# Patient Record
Sex: Female | Born: 1970 | Race: White | Hispanic: No | Marital: Single
Health system: Southern US, Community
[De-identification: ages and names within clinical notes are randomized; demographics above are authoritative.]

---

## 2020-03-18 ENCOUNTER — Encounter: Payer: Self-pay | Admitting: Family Medicine

## 2020-03-18 ENCOUNTER — Ambulatory Visit: Payer: Self-pay

## 2020-03-18 ENCOUNTER — Ambulatory Visit (INDEPENDENT_AMBULATORY_CARE_PROVIDER_SITE_OTHER): Payer: Managed Care, Other (non HMO) | Admitting: Family Medicine

## 2020-03-18 ENCOUNTER — Other Ambulatory Visit: Payer: Self-pay

## 2020-03-18 VITALS — BP 110/68 | HR 72 | Ht 66.0 in | Wt 121.0 lb

## 2020-03-18 DIAGNOSIS — M79604 Pain in right leg: Secondary | ICD-10-CM | POA: Diagnosis not present

## 2020-03-18 DIAGNOSIS — M25521 Pain in right elbow: Secondary | ICD-10-CM | POA: Diagnosis not present

## 2020-03-18 DIAGNOSIS — S56519A Strain of other extensor muscle, fascia and tendon at forearm level, unspecified arm, initial encounter: Secondary | ICD-10-CM

## 2020-03-18 DIAGNOSIS — S76311A Strain of muscle, fascia and tendon of the posterior muscle group at thigh level, right thigh, initial encounter: Secondary | ICD-10-CM

## 2020-03-18 NOTE — Patient Instructions (Addendum)
Xray today Lateral epi exercises Avoid overhand exercises Ice 20 min 2x a day Voltaren gel Wrist brace day and night for 2 weeks then at night for 2 weeks Thigh compression sleeve Shorten stride length See me again in 6 weeks

## 2020-03-18 NOTE — Assessment & Plan Note (Signed)
Partial tear noted fairly severe overall.  Patient is bringing in some blood flow.  Patient has had a history of headache so we will hold on nitroglycerin.  We did discuss potential PRP.  Wrist brace given today, avoid repetitive wrist extension, icing regimen encouraged.  Follow-up with me again 6 weeks.  Worsening pain will consider possible formal physical therapy as well as x-rays at next visit.  Patient wanted to hold on x-rays today secondary to transitioning insurance.

## 2020-03-18 NOTE — Assessment & Plan Note (Signed)
Nonspecific pain.  Has discomfort noted in the right hamstring muscle belly.  On ultrasound today no true masses appreciated but does have some thickening of the fascia.  This could be more of a chronic issue.  He is overdue.  Blood vessel in the area as well but fully compressible.  Discussed conservative therapy with heel lift, shortening stride length thigh compression and askilng exercises patient will try the conservative therapy and follow-up with me again in 6 weeks

## 2020-03-18 NOTE — Progress Notes (Signed)
[facility] [address] [place] [address] Phone: [phone] Subjective:   Bruce Donath, am serving as a scribe for Dr. Antoine Primas. This visit occurred during the SARS-CoV-2 public health emergency.  Safety protocols were in place, including screening questions prior to the visit, additional usage of staff PPE, and extensive cleaning of exam room while observing appropriate contact time as indicated for disinfecting solutions.   I'm seeing this patient by the request  of:  Precious Bard, MD  CC: Right elbow and right hamstring pain  [id number]  Stefanie Jenkins is a 50 y.o. female coming in with complaint of right hamstring pain for one year. Patient does not recall incident that caused pain. Pain located in mm belly. Uses LAX ball to relieve pain. Has also tried physical therapy and feels that activity helps to reduce her pain. Pain is 5/10 but is nagging. Patient walks, bikes and rollerblades.  Also complaining of right sided, lateral elbow pain for about 4 months. Having hard time lifting glass of water.  Patient does not remember any true injury.  Patient does do a lot of work at a computer.  Patient though has been working less recently and may be making a little improvement.  Has history of autoimmune issues. 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 Is gluten and dairy free.         Social History   Socioeconomic History  . Marital status: Single    Spouse name: Not on file  . Number of children: Not on file  . Years of education: Not on file  . Highest education level: Not on file  Occupational History  . Not on file  Tobacco Use  . Smoking status: Not on file  . Smokeless tobacco: Not on file  Substance and Sexual Activity  . Alcohol use: Not on file  . Drug use: Not on file  . Sexual activity: Not on file  Other Topics Concern  . Not on file  Social History Narrative  .  Not on file   Social Determinants of Health   Financial Resource Strain: Not on file  Food Insecurity: Not on file  Transportation Needs: Not on file  Physical Activity: Not on file  Stress: Not on file  Social Connections: Not on file   Not on File No family history on file.    Current Outpatient Medications (Respiratory):  .  montelukast (SINGULAIR) 10 MG tablet, Take 10 mg by mouth at bedtime.      Reviewed prior external information including notes and imaging from  primary care provider As well as notes that were available from care everywhere and other healthcare systems.  Past medical history, social, surgical and family history all reviewed in electronic medical record.  No pertanent information unless stated regarding to the chief complaint.   Review of Systems:  No headache, visual changes, nausea, vomiting, diarrhea, constipation, dizziness, abdominal pain, skin rash, fevers, chills, night sweats, weight loss, swollen lymph nodes, body aches, joint swelling, chest pain, shortness of breath, mood changes. POSITIVE muscle aches  Objective  Blood pressure 110/68, pulse 72, height 5\' 6"  (1.676 m), weight 121 lb (54.9 kg), SpO2 99 %.   General: No apparent distress alert and oriented x3 mood and affect normal, dressed appropriately.  HEENT: Pupils equal, extraocular movements intact  Respiratory: Patient's speak in full sentences and does not appear short of breath  Cardiovascular: No lower extremity edema,  non tender, no erythema  Gait mild antalgic MSK: Right hamstring does have tightness noted in the muscle belly but no true masses appreciated.  Patient does have good strength but does have pain with resisted flexion.  Full range of motion noted.  No pain in the knee.  Negative straight leg test.  Mild loss of lordosis of the lumbar spine noted.  Patient's right elbow does have tenderness over the lateral epicondylar area.  Worsening pain with resisted extension of  the wrist.  Patient no swelling noted no.  Full range of motion of the elbow.  Good grip strength.  Limited musculoskeletal ultrasound was performed and interpreted by Judi Saa  Limited ultrasound of patient's right lateral epicondylar area shows that patient does have a partial tear noted of the common extensor tendon.  Significant increase in neovascularization in Doppler flow noted.  Mild hypoechoic changes.  Possible mild retraction of the tendon noted.  Does seem to be more chronic or acute on chronic.  Regarding patient's hamstring possible thickening between the semitendinosus and the semimembranosus approximately 6 inches from ischial area.  Otherwise fairly unremarkable with no true masses appreciated.  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

## 2020-03-25 ENCOUNTER — Encounter: Payer: Self-pay | Admitting: *Deleted

## 2020-03-25 ENCOUNTER — Telehealth: Payer: Self-pay | Admitting: *Deleted

## 2020-03-25 NOTE — Telephone Encounter (Signed)
Pt called stating that Dr. Katrinka Blazing had mentioned trying nitroglycerin patches and at the OV she declined. But now after thinking about it she would like an rx sent into the [facility] pharmacy in [place].

## 2020-03-25 NOTE — Telephone Encounter (Signed)
I did not go over all the potential side effects and do not have them written down and she iws not in my chart.  I would say I would want her to know that information before we do it so then we should discuss at follow up

## 2020-03-25 NOTE — Telephone Encounter (Signed)
Can we print this for her.  Then we can send in the prescription when she picks it up Do the 0.1 patches   Nitroglycerin Protocol   Apply 1/4 nitroglycerin patch to affected area daily.  Change position of patch within the affected area every 24 hours.  You may experience a headache during the first 1-2 weeks of using the patch, these should subside.  If you experience headaches after beginning nitroglycerin patch treatment, you may take your preferred over the counter pain reliever.  Another side effect of the nitroglycerin patch is skin irritation or rash related to patch adhesive.  Please notify our office if you develop more severe headaches or rash, and stop the patch.  Tendon healing with nitroglycerin patch may require 12 to 24 weeks depending on the extent of injury.  Men should not use if taking Viagra, Cialis, or Levitra.   Do not use if you have migraines or rosacea.

## 2020-03-30 ENCOUNTER — Other Ambulatory Visit: Payer: Self-pay

## 2020-03-30 MED ORDER — NITROGLYCERIN 0.1 MG/HR TD PT24: 0.1000 mg | MEDICATED_PATCH | Freq: Every day | TRANSDERMAL | 0 refills | Status: AC

## 2020-03-30 NOTE — Telephone Encounter (Signed)
Paper script ok per a verbal from Dr. Katrinka Blazing. Script and instructions left at front desk. Patient notified.

## 2020-04-29 ENCOUNTER — Ambulatory Visit: Payer: Managed Care, Other (non HMO) | Admitting: Family Medicine

## 2020-05-17 ENCOUNTER — Ambulatory Visit: Payer: Managed Care, Other (non HMO) | Admitting: Family Medicine

## 2020-05-28 NOTE — Progress Notes (Signed)
[facility] [address] [place] [address] Phone: [phone] Subjective:   Stefanie Jenkins, am serving as a scribe for Dr. Antoine Primas. This visit occurred during the SARS-CoV-2 public health emergency.  Safety protocols were in place, including screening questions prior to the visit, additional usage of staff PPE, and extensive cleaning of exam room while observing appropriate contact time as indicated for disinfecting solutions.   I'm seeing this patient by the request  of:  Precious Bard, MD  CC: Extremity and elbow pain follow-up  BHA:[id number]   03/18/2020 Nonspecific pain.  Has discomfort noted in the right hamstring muscle belly.  On ultrasound today no true masses appreciated but does have some thickening of the fascia.  This could be more of a chronic issue.  He is overdue.  Blood vessel in the area as well but fully compressible.  Discussed conservative therapy with heel lift, shortening stride length thigh compression and askilng exercises patient will try the conservative therapy and follow-up with me again in 6 weeks  Partial tear noted fairly severe overall.  Patient is bringing in some blood flow.  Patient has had a history of headache so we will hold on nitroglycerin.  We did discuss potential PRP.  Wrist brace given today, avoid repetitive wrist extension, icing regimen encouraged.  Follow-up with me again 6 weeks.  Worsening pain will consider possible formal physical therapy as well as x-rays at next visit.  Patient wanted to hold on x-rays today secondary to transitioning insurance.  Update 05/31/2020 Stefanie Jenkins is a 50 y.o. female coming in with complaint of right elbow pain and right hamstring pain. Arm pain is improving. Does have tenderness in elbow. Would like to make ergonomic changes at her desk. Has not used nitro patches since last visit.   Patient notes getting a Peloton bike and her leg pain is better when she is on  the bike. Pain continues after activity and when riding in the car. Does wear thigh compression sleeve with activity but is developing heat rash.        No past medical history on file. No past surgical history on file. Social History   Socioeconomic History  . Marital status: Single    Spouse name: Not on file  . Number of children: Not on file  . Years of education: Not on file  . Highest education level: Not on file  Occupational History  . Not on file  Tobacco Use  . Smoking status: Not on file  . Smokeless tobacco: Not on file  Substance and Sexual Activity  . Alcohol use: Not on file  . Drug use: Not on file  . Sexual activity: Not on file  Other Topics Concern  . Not on file  Social History Narrative  . Not on file   Social Determinants of Health   Financial Resource Strain: Not on file  Food Insecurity: Not on file  Transportation Needs: Not on file  Physical Activity: Not on file  Stress: Not on file  Social Connections: Not on file   Not on File No family history on file.   Current Outpatient Medications (Cardiovascular):  .  nitroGLYCERIN (NITRODUR - DOSED IN MG/24 HR) 0.1 mg/hr patch, Place 1 patch (0.1 mg total) onto the skin daily. Place 1/4 of a patch on skin once daily.  Current Outpatient Medications (Respiratory):  .  montelukast (SINGULAIR) 10 MG tablet, Take 10 mg by mouth at bedtime.  Current Outpatient Medications (Other):  .  gabapentin (NEURONTIN) 100 MG capsule, Take 2 capsules (200 mg total) by mouth at bedtime.   Reviewed prior external information including notes and imaging from  primary care provider As well as notes that were available from care everywhere and other healthcare systems.  Past medical history, social, surgical and family history all reviewed in electronic medical record.  No pertanent information unless stated regarding to the chief complaint.   Review of Systems:  No headache, visual changes, nausea,  vomiting, diarrhea, constipation, dizziness, abdominal pain, skin rash, fevers, chills, night sweats, weight loss, swollen lymph nodes, body aches, joint swelling, chest pain, shortness of breath, mood changes. POSITIVE muscle aches  Objective  Blood pressure 110/72, pulse 79, height 5\' 6"  (1.676 m), weight 120 lb (54.4 kg), SpO2 99 %.   General: No apparent distress alert and oriented x3 mood and affect normal, dressed appropriately.  HEENT: Pupils equal, extraocular movements intact  Respiratory: Patient's speak in full sentences and does not appear short of breath  Cardiovascular: No lower extremity edema, non tender, no erythema  Gait normal with good balance and coordination.  MSK:  Right elbow TTP over the lateral epi area, mild pain with resistance of the wrist  Contralateral elbow fairly unremarkable. Patient's low back mild tenderness to palpation of the right sacroiliac joint.  Tightness noted with straight leg test on the right side.  Hamstring does have some tenderness noted in the mid substance.  No masses appreciated.  Patient still has some mild pain with resisted flexion of the knee in the mid substance of the hamstring.  No pain over the ischial area.  Limited musculoskeletal ultrasound was performed and interpreted by  Limited ultrasound of patient's right elbow shows that at the lateral epicondylar region the common extensor tendon still has a superficial tear noted.  Hypoechoic changes noted.  Significant increase in Doppler flow in the vicinity as well. 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

## 2020-05-31 ENCOUNTER — Ambulatory Visit: Payer: No Typology Code available for payment source

## 2020-05-31 ENCOUNTER — Other Ambulatory Visit: Payer: Self-pay

## 2020-05-31 ENCOUNTER — Ambulatory Visit (INDEPENDENT_AMBULATORY_CARE_PROVIDER_SITE_OTHER): Payer: No Typology Code available for payment source | Admitting: Family Medicine

## 2020-05-31 ENCOUNTER — Ambulatory Visit: Payer: Self-pay

## 2020-05-31 ENCOUNTER — Ambulatory Visit (INDEPENDENT_AMBULATORY_CARE_PROVIDER_SITE_OTHER): Payer: No Typology Code available for payment source

## 2020-05-31 ENCOUNTER — Encounter: Payer: Self-pay | Admitting: Family Medicine

## 2020-05-31 VITALS — BP 110/72 | HR 79 | Ht 66.0 in | Wt 120.0 lb

## 2020-05-31 DIAGNOSIS — S56519A Strain of other extensor muscle, fascia and tendon at forearm level, unspecified arm, initial encounter: Secondary | ICD-10-CM | POA: Diagnosis not present

## 2020-05-31 DIAGNOSIS — S76311D Strain of muscle, fascia and tendon of the posterior muscle group at thigh level, right thigh, subsequent encounter: Secondary | ICD-10-CM

## 2020-05-31 DIAGNOSIS — N762 Acute vulvitis: Secondary | ICD-10-CM | POA: Insufficient documentation

## 2020-05-31 DIAGNOSIS — M25521 Pain in right elbow: Secondary | ICD-10-CM | POA: Diagnosis not present

## 2020-05-31 DIAGNOSIS — M79604 Pain in right leg: Secondary | ICD-10-CM

## 2020-05-31 MED ORDER — GABAPENTIN 100 MG PO CAPS: 200.0000 mg | ORAL_CAPSULE | Freq: Every day | ORAL | 0 refills | Status: DC

## 2020-05-31 NOTE — Assessment & Plan Note (Signed)
Patient feels like she has not made any significant progress.  Would like to get x-rays to further evaluate to make sure patient does not have any other bony abnormality that can contribute.  Differential does include the possibility of lumbar radiculopathy and will need to consider the possibility of an MRI.  Patient does have a mild positive straight leg test.  Patient is adamant she would like to know what is going on and would like to wait not longer so she would like [place] to order the MRI now.  Patient has already failed greater than 6 weeks of conservative therapy.  Discussed with patient about icing regimen, home exercises, which activities to do which wants to avoid.  Follow-up with me again 6 weeks

## 2020-05-31 NOTE — Assessment & Plan Note (Signed)
Patient is some partial tearing noted.  Discussed with patient in great length.  Discussed with patient about the possibility of the nitroglycerin patch which patient never started.  Patient has a.  Warned of side effects again.  Patient will consider starting it.  We also discussed the other alternative is PRP.  Patient will consider it.  Discussed icing regimen and home exercises.  Follow-up with me again 4 days weeks

## 2020-05-31 NOTE — Patient Instructions (Addendum)
Xray today Gabapentin 100mg  at night and can bump up to 200mg  if needed MRI lumbar spine [id number] Read about PRP Nitroglycerin Protocol   Apply 1/4 nitroglycerin patch to affected area daily.  Change position of patch within the affected area every 24 hours.  You may experience a headache during the first 1-2 weeks of using the patch, these should subside.  If you experience headaches after beginning nitroglycerin patch treatment, you may take your preferred over the counter pain reliever.  Another side effect of the nitroglycerin patch is skin irritation or rash related to patch adhesive.  Please notify our office if you develop more severe headaches or rash, and stop the patch.  Tendon healing with nitroglycerin patch may require 12 to 24 weeks depending on the extent of injury.  Men should not use if taking Viagra, Cialis, or Levitra.   Do not use if you have migraines or rosacea.  See me again in 6 weeks

## 2020-06-30 ENCOUNTER — Other Ambulatory Visit: Payer: Self-pay | Admitting: Family Medicine

## 2020-07-13 ENCOUNTER — Ambulatory Visit
Admission: RE | Admit: 2020-07-13 | Discharge: 2020-07-13 | Disposition: A | Discharge status: Home / Self Care | Payer: No Typology Code available for payment source | Source: Ambulatory Visit | Attending: Family Medicine | Admitting: Family Medicine

## 2020-07-13 ENCOUNTER — Other Ambulatory Visit: Payer: Self-pay

## 2020-07-13 DIAGNOSIS — M25521 Pain in right elbow: Secondary | ICD-10-CM

## 2020-08-14 ENCOUNTER — Other Ambulatory Visit: Payer: Self-pay | Admitting: Family Medicine

## 2020-08-20 ENCOUNTER — Other Ambulatory Visit: Payer: Self-pay | Admitting: Family Medicine

## 2020-08-23 NOTE — Telephone Encounter (Signed)
Sent patient MyChart message to confirm how she is using medication.

## 2020-08-27 MED ORDER — GABAPENTIN 100 MG PO CAPS: 200.0000 mg | ORAL_CAPSULE | Freq: Every day | ORAL | 0 refills | Status: DC

## 2020-09-22 ENCOUNTER — Ambulatory Visit: Payer: No Typology Code available for payment source | Admitting: Family Medicine

## 2020-10-05 ENCOUNTER — Other Ambulatory Visit: Payer: Self-pay | Admitting: Family Medicine

## 2020-11-18 ENCOUNTER — Other Ambulatory Visit: Payer: Self-pay | Admitting: Family Medicine

## 2020-12-25 ENCOUNTER — Other Ambulatory Visit: Payer: Self-pay | Admitting: Family Medicine

## 2021-01-22 ENCOUNTER — Other Ambulatory Visit: Payer: Self-pay | Admitting: Family Medicine

## 2021-03-14 ENCOUNTER — Other Ambulatory Visit: Payer: Self-pay | Admitting: Family Medicine

## 2022-07-01 IMAGING — MR MR LUMBAR SPINE W/O CM
4 of 5 series · 25 of 48 positions shown · non-contrast
Comparison: None.

CLINICAL DATA: Lower back pain for over 6 weeks. Right gluteal and
leg pain for 2 years

EXAM:
MRI LUMBAR SPINE WITHOUT CONTRAST
TECHNIQUE: Multiplanar, multisequence MR imaging of the lumbar spine was
performed. No intravenous contrast was administered.

[Series 3: T2 · sagittal · 4.0mm · 0.53mm/px · 7 of 16 slices shown (1 of 2)]
[im 1/16]
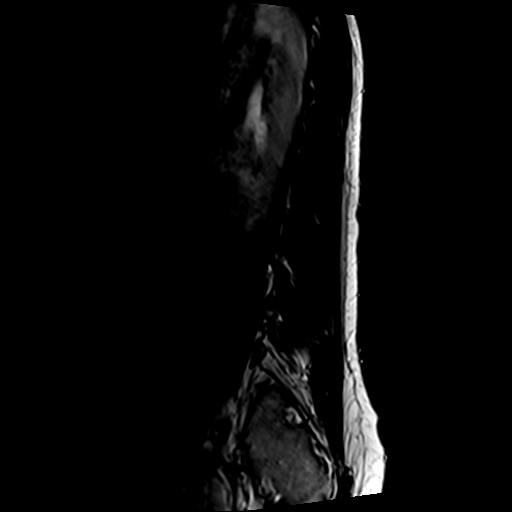
[im 3/16]
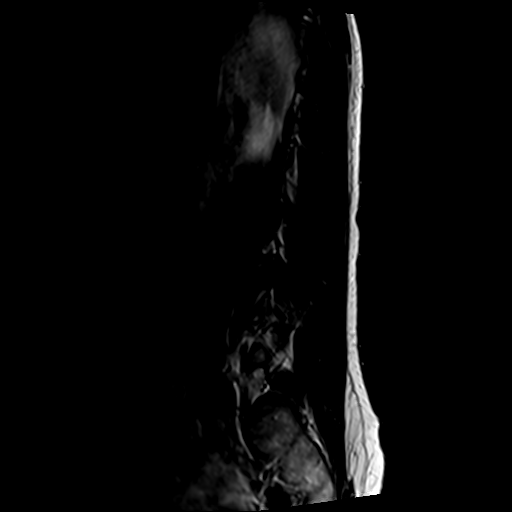
[im 6/16]
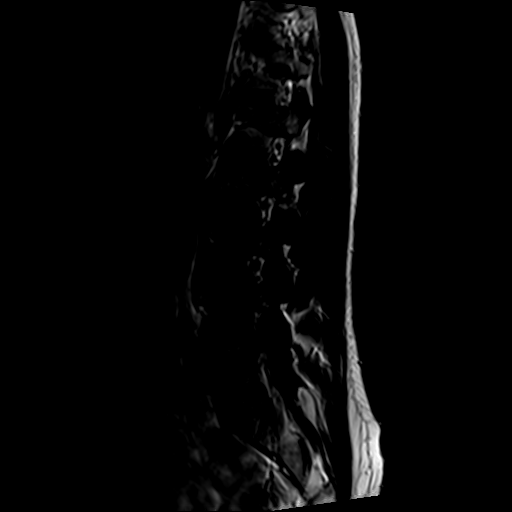
[im 8/16]
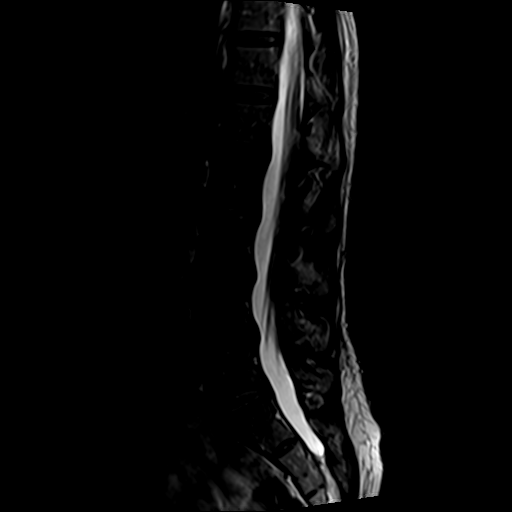
[im 11/16]
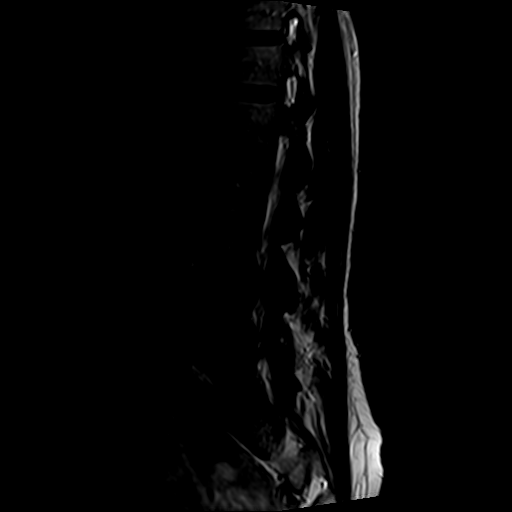
[im 13/16]
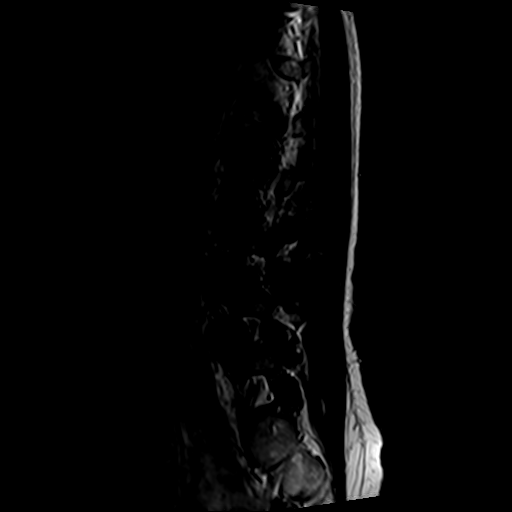
[im 16/16]
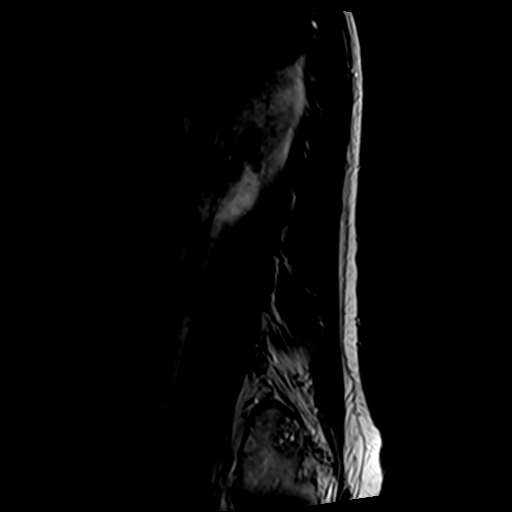

[Series 5: T1 · sagittal · 4.0mm · 0.53mm/px · 6 of 16 slices shown (1 of 2)]
[im 1/16]
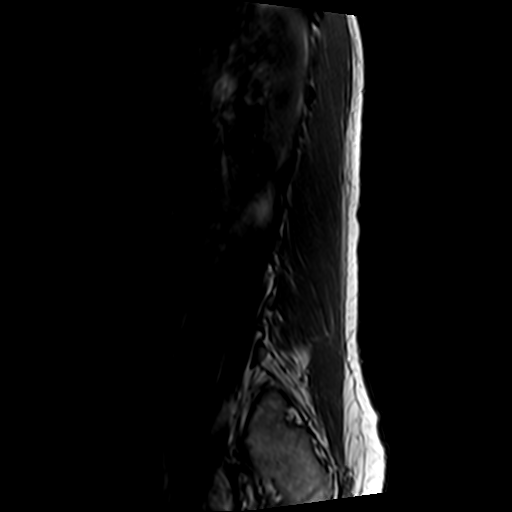
[im 4/16]
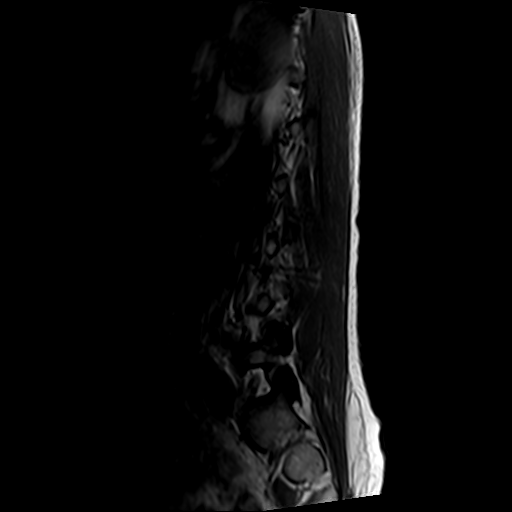
[im 7/16]
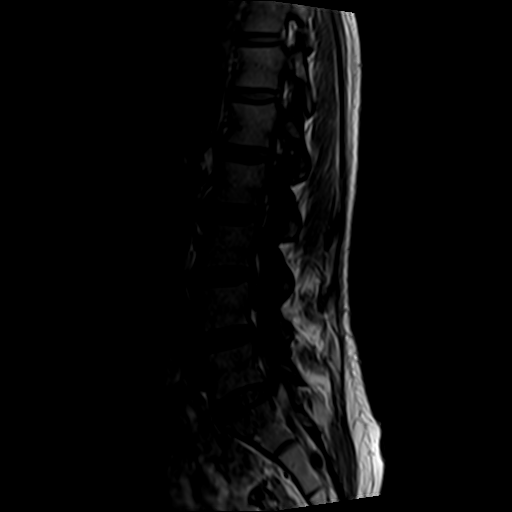
[im 10/16]
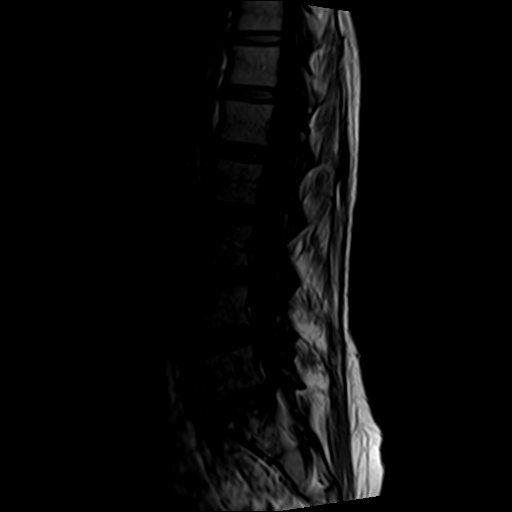
[im 13/16]
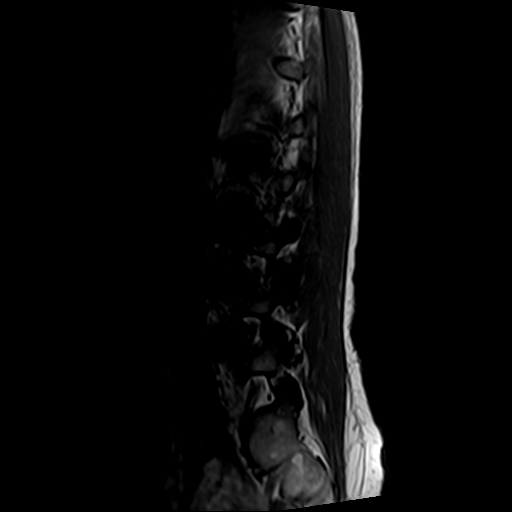
[im 16/16]
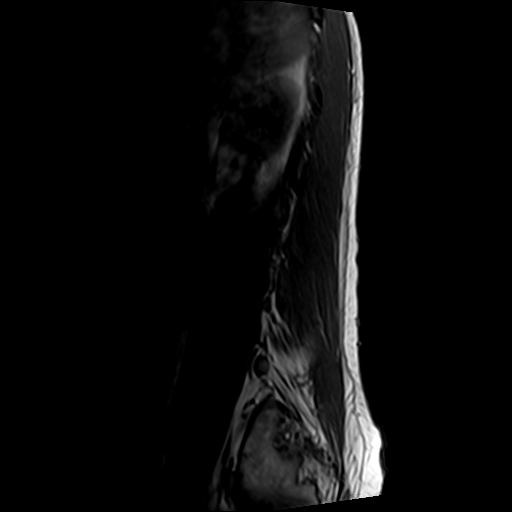

[Series 6: T2 · axial · 4.0mm · 0.70mm/px · z∈[-53,+151]mm · 8 of 36 slices shown (2 of 2)]
[im 1/36]
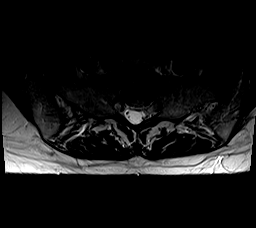
[im 6/36]
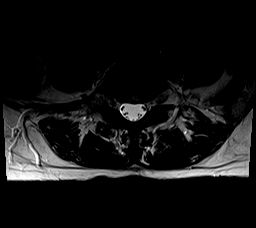
[im 11/36]
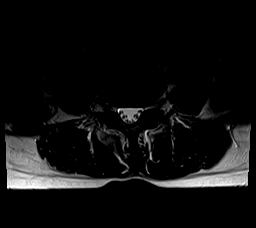
[im 17/36]
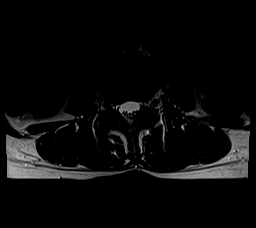
[im 19/36]
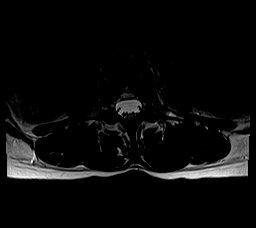
[im 25/36]
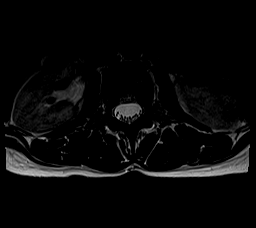
[im 30/36]
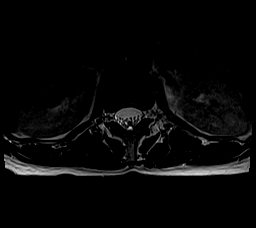
[im 36/36]
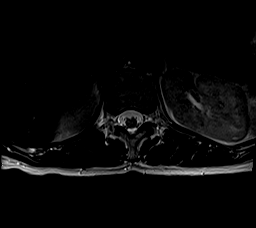

[Series 7: T1 · axial · 4.0mm · 0.35mm/px · z∈[-53,+120]mm · 4 of 36 slices shown (2 of 2)]
[im 1/36]
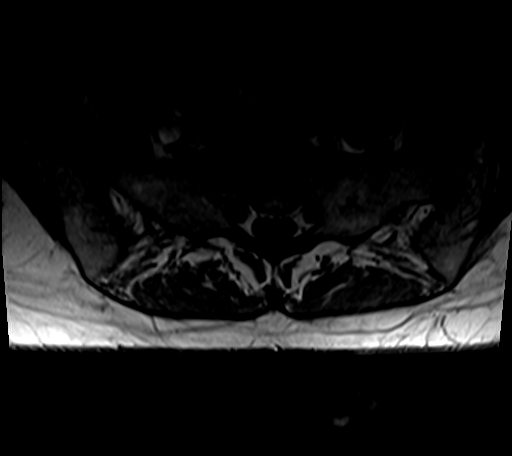
[im 6/36]
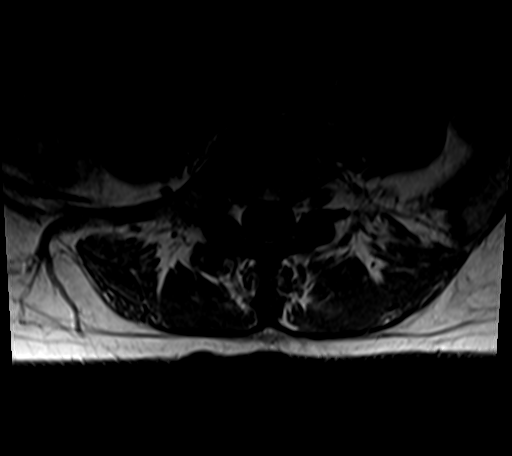
[im 19/36]
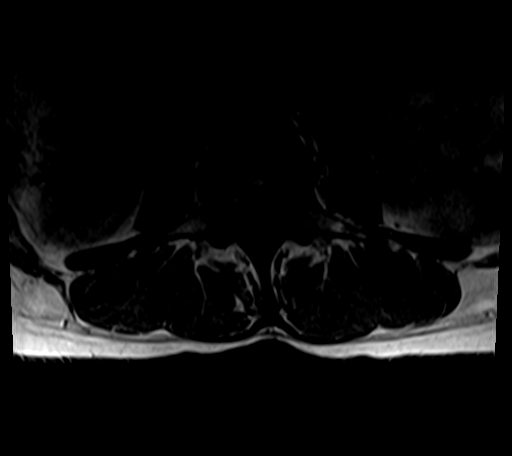
[im 30/36]
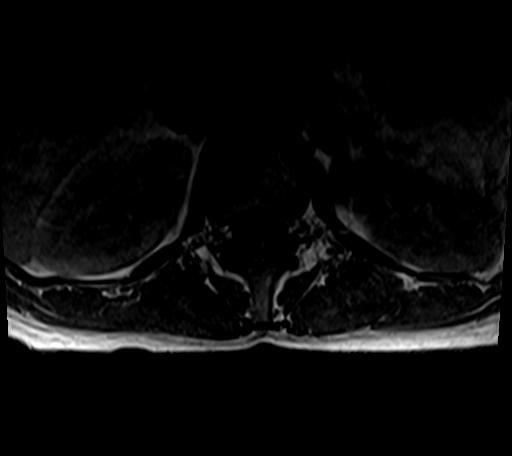

[25 of 48 positions shown; findings below may reference images not displayed]

FINDINGS: Segmentation:  5 lumbar type vertebrae

Alignment:  Normal

Vertebrae:  No fracture, evidence of discitis, or bone lesion.

Conus medullaris and cauda equina: Conus extends to the L1-2 level.
Conus and cauda equina appear normal.

Paraspinal and other soft tissues: No evidence of mass or
inflammation.

Disc levels:

T12- L1: Unremarkable.

L1-L2: Unremarkable.

L2-L3: Unremarkable.

L3-L4: Disc narrowing with right paracentral protrusion impinging on
the descending right L4 nerve root. Mild facet spurring and early
ligamentous thickening

L4-L5: Disc narrowing and bulging. Degenerative facet spurring on
both sides. Mild triangular narrowing of the thecal sac. Patent
foramina

L5-S1:Unremarkable.
IMPRESSION: 1. L3-4 right paracentral protrusion with mass effect on the right
L4 nerve root. No other finding to correlate with right
radiculopathy.
2. L4-5 degeneration without neural impingement.
